# Patient Record
Sex: Female | Born: 2005 | Race: Black or African American | Hispanic: No | Marital: Single | Smoking: Never smoker
Health system: Southern US, Community
[De-identification: ages and names within clinical notes are randomized; demographics above are authoritative.]

---
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
## 2023-01-10 DIAGNOSIS — J00 Acute nasopharyngitis [common cold]: Secondary | ICD-10-CM | POA: Diagnosis not present

## 2023-01-10 DIAGNOSIS — R051 Acute cough: Secondary | ICD-10-CM | POA: Diagnosis not present

## 2023-01-10 DIAGNOSIS — Z1152 Encounter for screening for COVID-19: Secondary | ICD-10-CM | POA: Diagnosis not present

## 2023-01-10 DIAGNOSIS — K5900 Constipation, unspecified: Secondary | ICD-10-CM | POA: Diagnosis not present

## 2023-01-28 DIAGNOSIS — D509 Iron deficiency anemia, unspecified: Secondary | ICD-10-CM | POA: Diagnosis not present

## 2023-01-28 DIAGNOSIS — Z1329 Encounter for screening for other suspected endocrine disorder: Secondary | ICD-10-CM | POA: Diagnosis not present

## 2023-01-28 DIAGNOSIS — R5383 Other fatigue: Secondary | ICD-10-CM | POA: Diagnosis not present

## 2023-02-10 DIAGNOSIS — L700 Acne vulgaris: Secondary | ICD-10-CM | POA: Diagnosis not present

## 2023-02-10 DIAGNOSIS — L810 Postinflammatory hyperpigmentation: Secondary | ICD-10-CM | POA: Diagnosis not present

## 2023-02-10 DIAGNOSIS — L750 Bromhidrosis: Secondary | ICD-10-CM | POA: Diagnosis not present
